# Patient Record
Sex: Female | Born: 1960 | Race: White | Hispanic: No | Marital: Single | Smoking: Former smoker
Health system: Southern US, Community
[De-identification: ages and names within clinical notes are randomized; demographics above are authoritative.]

## PROBLEM LIST (undated history)

## (undated) DIAGNOSIS — K5010 Crohn's disease of large intestine without complications: Secondary | ICD-10-CM

## (undated) HISTORY — PX: OTHER SURGICAL HISTORY: SHX169

---

## 2004-03-23 ENCOUNTER — Ambulatory Visit: Payer: Self-pay

## 2004-06-08 ENCOUNTER — Ambulatory Visit (HOSPITAL_BASED_OUTPATIENT_CLINIC_OR_DEPARTMENT_OTHER): Admission: RE | Admit: 2004-06-08 | Discharge: 2004-06-08 | Payer: Self-pay | Admitting: Orthopedic Surgery

## 2005-08-02 ENCOUNTER — Ambulatory Visit: Payer: Self-pay | Admitting: Family Medicine

## 2008-07-25 ENCOUNTER — Ambulatory Visit: Payer: Self-pay | Admitting: Family Medicine

## 2016-11-08 ENCOUNTER — Encounter: Payer: Self-pay | Admitting: Emergency Medicine

## 2016-11-08 ENCOUNTER — Emergency Department
Admission: EM | Admit: 2016-11-08 | Discharge: 2016-11-08 | Disposition: A | Discharge status: Home / Self Care | Payer: 59 | Attending: Emergency Medicine | Admitting: Emergency Medicine

## 2016-11-08 ENCOUNTER — Emergency Department: Payer: 59

## 2016-11-08 DIAGNOSIS — M25512 Pain in left shoulder: Secondary | ICD-10-CM | POA: Insufficient documentation

## 2016-11-08 DIAGNOSIS — R079 Chest pain, unspecified: Secondary | ICD-10-CM | POA: Diagnosis present

## 2016-11-08 DIAGNOSIS — Z87891 Personal history of nicotine dependence: Secondary | ICD-10-CM | POA: Insufficient documentation

## 2016-11-08 DIAGNOSIS — R51 Headache: Secondary | ICD-10-CM | POA: Insufficient documentation

## 2016-11-08 DIAGNOSIS — Z79899 Other long term (current) drug therapy: Secondary | ICD-10-CM | POA: Insufficient documentation

## 2016-11-08 DIAGNOSIS — R519 Headache, unspecified: Secondary | ICD-10-CM

## 2016-11-08 HISTORY — DX: Crohn's disease of large intestine without complications: K50.10

## 2016-11-08 LAB — BASIC METABOLIC PANEL
Anion gap: 9 (ref 5–15)
BUN: 8 mg/dL (ref 6–20)
CO2: 28 mmol/L (ref 22–32)
CREATININE: 0.88 mg/dL (ref 0.44–1.00)
Calcium: 9.7 mg/dL (ref 8.9–10.3)
Chloride: 104 mmol/L (ref 101–111)
GFR calc Af Amer: >60 mL/min (ref >60)
GLUCOSE: 150 mg/dL — AB (ref 65–99)
POTASSIUM: 3.4 mmol/L — AB (ref 3.5–5.1)
SODIUM: 141 mmol/L (ref 135–145)

## 2016-11-08 LAB — CBC
HEMATOCRIT: 41.8 % (ref 35.0–47.0)
Hemoglobin: 14.5 g/dL (ref 12.0–16.0)
MCH: 29.6 pg (ref 26.0–34.0)
MCHC: 34.7 g/dL (ref 32.0–36.0)
MCV: 85.4 fL (ref 80.0–100.0)
PLATELETS: 180 10*3/uL (ref 150–440)
RBC: 4.9 MIL/uL (ref 3.80–5.20)
RDW: 13.5 % (ref 11.5–14.5)
WBC: 6.8 10*3/uL (ref 3.6–11.0)

## 2016-11-08 LAB — TROPONIN I
Troponin I: <0.03 ng/mL (ref <0.03)
Troponin I: <0.03 ng/mL (ref <0.03)

## 2016-11-08 LAB — FIBRIN DERIVATIVES D-DIMER (ARMC ONLY): Fibrin derivatives D-dimer (ARMC): 187.57 (ref 0.00–499.00)

## 2016-11-08 MED ORDER — ACETAMINOPHEN 500 MG PO TABS: 1000.0000 mg | ORAL_TABLET | Freq: Three times a day (TID) | ORAL | 0 refills | Status: AC

## 2016-11-08 MED ORDER — METOCLOPRAMIDE HCL 5 MG/ML IJ SOLN
10.0000 mg | Freq: Once | INTRAMUSCULAR | Status: AC
  Administered 2016-11-08: 10 mg via INTRAVENOUS
  Filled 2016-11-08: qty 2

## 2016-11-08 MED ORDER — DIPHENHYDRAMINE HCL 50 MG/ML IJ SOLN
25.0000 mg | Freq: Once | INTRAMUSCULAR | Status: AC
  Administered 2016-11-08: 25 mg via INTRAVENOUS
  Filled 2016-11-08: qty 1

## 2016-11-08 MED ORDER — DICLOFENAC SODIUM 1 % TD GEL: TRANSDERMAL | 0 refills | Status: AC

## 2016-11-08 MED ORDER — PREDNISONE 10 MG PO TABS: 40.0000 mg | ORAL_TABLET | Freq: Every day | ORAL | 0 refills | Status: AC

## 2016-11-08 MED ORDER — KETOROLAC TROMETHAMINE 30 MG/ML IJ SOLN
15.0000 mg | Freq: Once | INTRAMUSCULAR | Status: AC
  Administered 2016-11-08: 15 mg via INTRAVENOUS
  Filled 2016-11-08: qty 1

## 2016-11-08 NOTE — Discharge Instructions (Signed)
Apply Voltaren gel and massage it in place 4 times a day, apply heat to the area multiple times a day, take Tylenol 1000 mg every 8 hours and prednisone 40 mg once a day for the next 3-5 days. If after 2 days you are not feeling improved and your symptoms have not resolved please return to the emergency room for further evaluation. Otherwise follow-up with her primary care doctor once you return home. Return to the emergency room if you have new or worsening chest pain, shortness of breath, facial droop, slurred speech, unilateral weakness or numbness, or any other symptoms concerning to you.

## 2016-11-08 NOTE — ED Provider Notes (Signed)
[facility] Emergency Department Provider Note  ____________________________________________  Time seen: Approximately 12:09 PM  I have reviewed the triage vital signs and the nursing notes.   HISTORY  Chief Complaint Chest Pain   HPI Paula Ross is a 56 y.o. female with a history of Crohn's disease and left rotator cuff tear repair who presents for evaluation of chest pain. Patient reports for the last 4 days she has had pain that she describes as dull/squeezing located in her left upper chest close to her shoulder joint radiating to her L arm and L neck. The pain has been constant and worse with movement of her shoulder. She denies shortness of breath but does tell me that sometimes he feels a little hard to take a deep breath because of the pain. She denies any trauma to her shoulder. She has family history of ischemic heart disease in both her mom and father. She is not a smoker. She has never seen a cardiologist or underwent a stress test. She currently reports that her pain is a 6 out of 10. She denies personal or family history of blood clots, hemoptysis, exogenous hormones. She does report that a month ago she drove 11 hours from [place] here to visit her son. She is also complaining of a left-sided headache that has similar characteristics as the pain in her shoulder, goal, 7 out of 10, constant for 4 days. She does have a history of migraine headaches and she was younger but those usually were bilateral. She has been taking Tylenol 325 mg and ibuprofen 100 mg at home once a day with no relief of her symptoms. No thunderclap HA, no neuro deficits, no paresthesias of her extremities.   Past Medical History:  Diagnosis Date  . Crohn's colitis (HCC)     There are no active problems to display for this patient.   Past Surgical History:  Procedure Laterality Date  . shoulder surgery      Prior to Admission medications   Medication Sig Start Date  End Date Taking? Authorizing Provider  acetaminophen (TYLENOL) 500 MG tablet Take 2 tablets (1,000 mg total) by mouth every 8 (eight) hours. 11/08/16 11/11/16  Nita Sickle, MD  diclofenac sodium (VOLTAREN) 1 % GEL Apply to the L shoulder and neck and massage it 4 times a day 11/08/16   Don Perking, [place], MD  predniSONE (DELTASONE) 10 MG tablet Take 4 tablets (40 mg total) by mouth daily. 11/08/16 11/13/16  Nita Sickle, MD    Allergies Penicillins  Family History Father - heart attack Mother - heart attack  Social History Social History  Substance Use Topics  . Smoking status: Former Games developer  . Smokeless tobacco: Never Used  . Alcohol use Yes    Review of Systems  Constitutional: Negative for fever. Eyes: Negative for visual changes. ENT: Negative for sore throat. Neck: No neck pain  Cardiovascular: + chest pain. Respiratory: + shortness of breath. Gastrointestinal: Negative for abdominal pain, vomiting or diarrhea. Genitourinary: Negative for dysuria. Musculoskeletal: Negative for back pain. + L shoulder pain Skin: Negative for rash. Neurological: Negative for weakness or numbness. + HA Psych: No SI or HI  ____________________________________________   PHYSICAL EXAM:  VITAL SIGNS: ED Triage Vitals  Enc Vitals Group     BP 11/08/16 0912 (!) 150/77     Pulse Rate 11/08/16 0912 77     Resp 11/08/16 0912 18     Temp 11/08/16 0912 97.8 F (36.6 C)  Temp Source 11/08/16 0912 Oral     SpO2 11/08/16 0912 97 %     Weight 11/08/16 0913 170 lb (77.1 kg)     Height 11/08/16 0913 5\' 9"  (1.753 m)     Head Circumference --      Peak Flow --      Pain Score 11/08/16 0925 7     Pain Loc --      Pain Edu? --      Excl. in GC? --     Constitutional: Alert and oriented. Well appearing and in no apparent distress. HEENT:      Head: Normocephalic and atraumatic.         Eyes: Conjunctivae are normal. Sclera is non-icteric.       Mouth/Throat: Mucous membranes are  moist.       Neck: Supple with no signs of meningismus. Cardiovascular: Regular rate and rhythm. No murmurs, gallops, or rubs. 2+ symmetrical distal pulses are present in all extremities. No JVD. Respiratory: Normal respiratory effort. Lungs are clear to auscultation bilaterally. No wheezes, crackles, or rhonchi.  Gastrointestinal: Soft, non tender, and non distended with positive bowel sounds. No rebound or guarding. Musculoskeletal: Extension and abduction of the the left arm exacerbates the pain, Left shoulder ROM is cogwhelling and not smooth and exacerbates the pain. Neurologic: Normal speech and language. A & O x3, PERRL, no nystagmus, CN II-XII intact, motor testing reveals good tone and bulk throughout. There is no evidence of pronator drift or dysmetria. Muscle strength is 5/5 throughout. Sensory examination is intact. Gait is normal. Skin: Skin is warm, dry and intact. No rash noted. Psychiatric: Mood and affect are normal. Speech and behavior are normal.  ____________________________________________   LABS (all labs ordered are listed, but only abnormal results are displayed)  Labs Reviewed  BASIC METABOLIC PANEL - Abnormal; Notable for the following:       Result Value   Potassium 3.4 (*)    Glucose, Bld 150 (*)    All other components within normal limits  CBC  TROPONIN I  TROPONIN I  FIBRIN DERIVATIVES D-DIMER ([facility] ONLY)   ____________________________________________  EKG  ED ECG REPORT I, Nita Sicklearolina Ree Alcalde, the attending physician, personally viewed and interpreted this ECG.  Normal sinus rhythm with occasional PVCs, rate of 85, normal intervals, normal axis, no ST elevations or depressions. ____________________________________________  RADIOLOGY  CXR: Negative  ____________________________________________   PROCEDURES  Procedure(s) performed: None Procedures Critical Care performed:  None ____________________________________________   INITIAL  IMPRESSION / ASSESSMENT AND PLAN / ED COURSE   56 y.o. female with a history of Crohn's disease and left rotator cuff tear repair who presents for evaluation of chest pain constant x 4 days and HA. At this time based on physical and history a do believe her pain is coming from her left shoulder. She has cogwheeling with movement of her left shoulder joint and exacerbation of pain. The fact that her headache is also located on the left as well I believe her symptoms are due to MSK pain. However since patient has had a recent prolonged trip complaining of mild shortness of breath and will send a d-dimer to rule out PE. We'll treat her symptoms with anti-inflammatory medication. EKG and first troponin normal. Heart score of 3 for family history and age.   ----------------------------------------- 12:18 PM on 11/08/2016 -----------------------------------------   OBSERVATION CARE: This patient is being placed under observation care for the following reasons: Chest pain with repeat testing to rule out ischemia  -----------------------------------------  1:18 PM on 11/08/2016 -----------------------------------------  Patient feels improved. D-dimer and 2nd troponin pending.   ----------------------------------------- 1:50 PM on 11/08/2016 -----------------------------------------   END OF OBSERVATION STATUS: After an appropriate period of observation, this patient is being discharged due to the following reason(s):     Clinical Course as of Nov 08 1348  Mon Nov 08, 2016  1343 Patient feels markedly improved with anti-inflammatory medication. Labs WNL. I will discharge her home on Voltaren gel, high-dose Tylenol, and a short course of prednisone for inflammation of her shoulder. Recommended she returns to the emergency room if her pain is getting worse or she develops any new symptoms including any signs or symptoms of stroke.  [CV]    Clinical Course User Index [CV] Nita SickleVeronese, [place], MD      Pertinent labs & imaging results that were available during my care of the patient were reviewed by me and considered in my medical decision making (see chart for details).    ____________________________________________   FINAL CLINICAL IMPRESSION(S) / ED DIAGNOSES  Final diagnoses:  Acute pain of left shoulder  Acute nonintractable headache, unspecified headache type      NEW MEDICATIONS STARTED DURING THIS VISIT:  New Prescriptions   ACETAMINOPHEN (TYLENOL) 500 MG TABLET    Take 2 tablets (1,000 mg total) by mouth every 8 (eight) hours.   DICLOFENAC SODIUM (VOLTAREN) 1 % GEL    Apply to the L shoulder and neck and massage it 4 times a day   PREDNISONE (DELTASONE) 10 MG TABLET    Take 4 tablets (40 mg total) by mouth daily.     Note:  This document was prepared using Dragon voice recognition software and may include unintentional dictation errors.    Don PerkingVeronese, [place], MD 11/08/16 1350

## 2016-11-08 NOTE — ED Triage Notes (Signed)
Pt reports left side chest pain that radiates to left arm and neck for 3-4 days. Pt reports associated shortness of breath.

## 2018-04-13 IMAGING — CR DG CHEST 2V
2 series · 2 of 2 positions shown · non-contrast
Comparison: Left shoulder MRI 10/18/2005.

CLINICAL DATA: 56 year old female with left side chest pain
radiating to the neck and arm 3-4 days. Shortness breath. Former
smoker.

EXAM:
CHEST  2 VIEW

[chest pa]
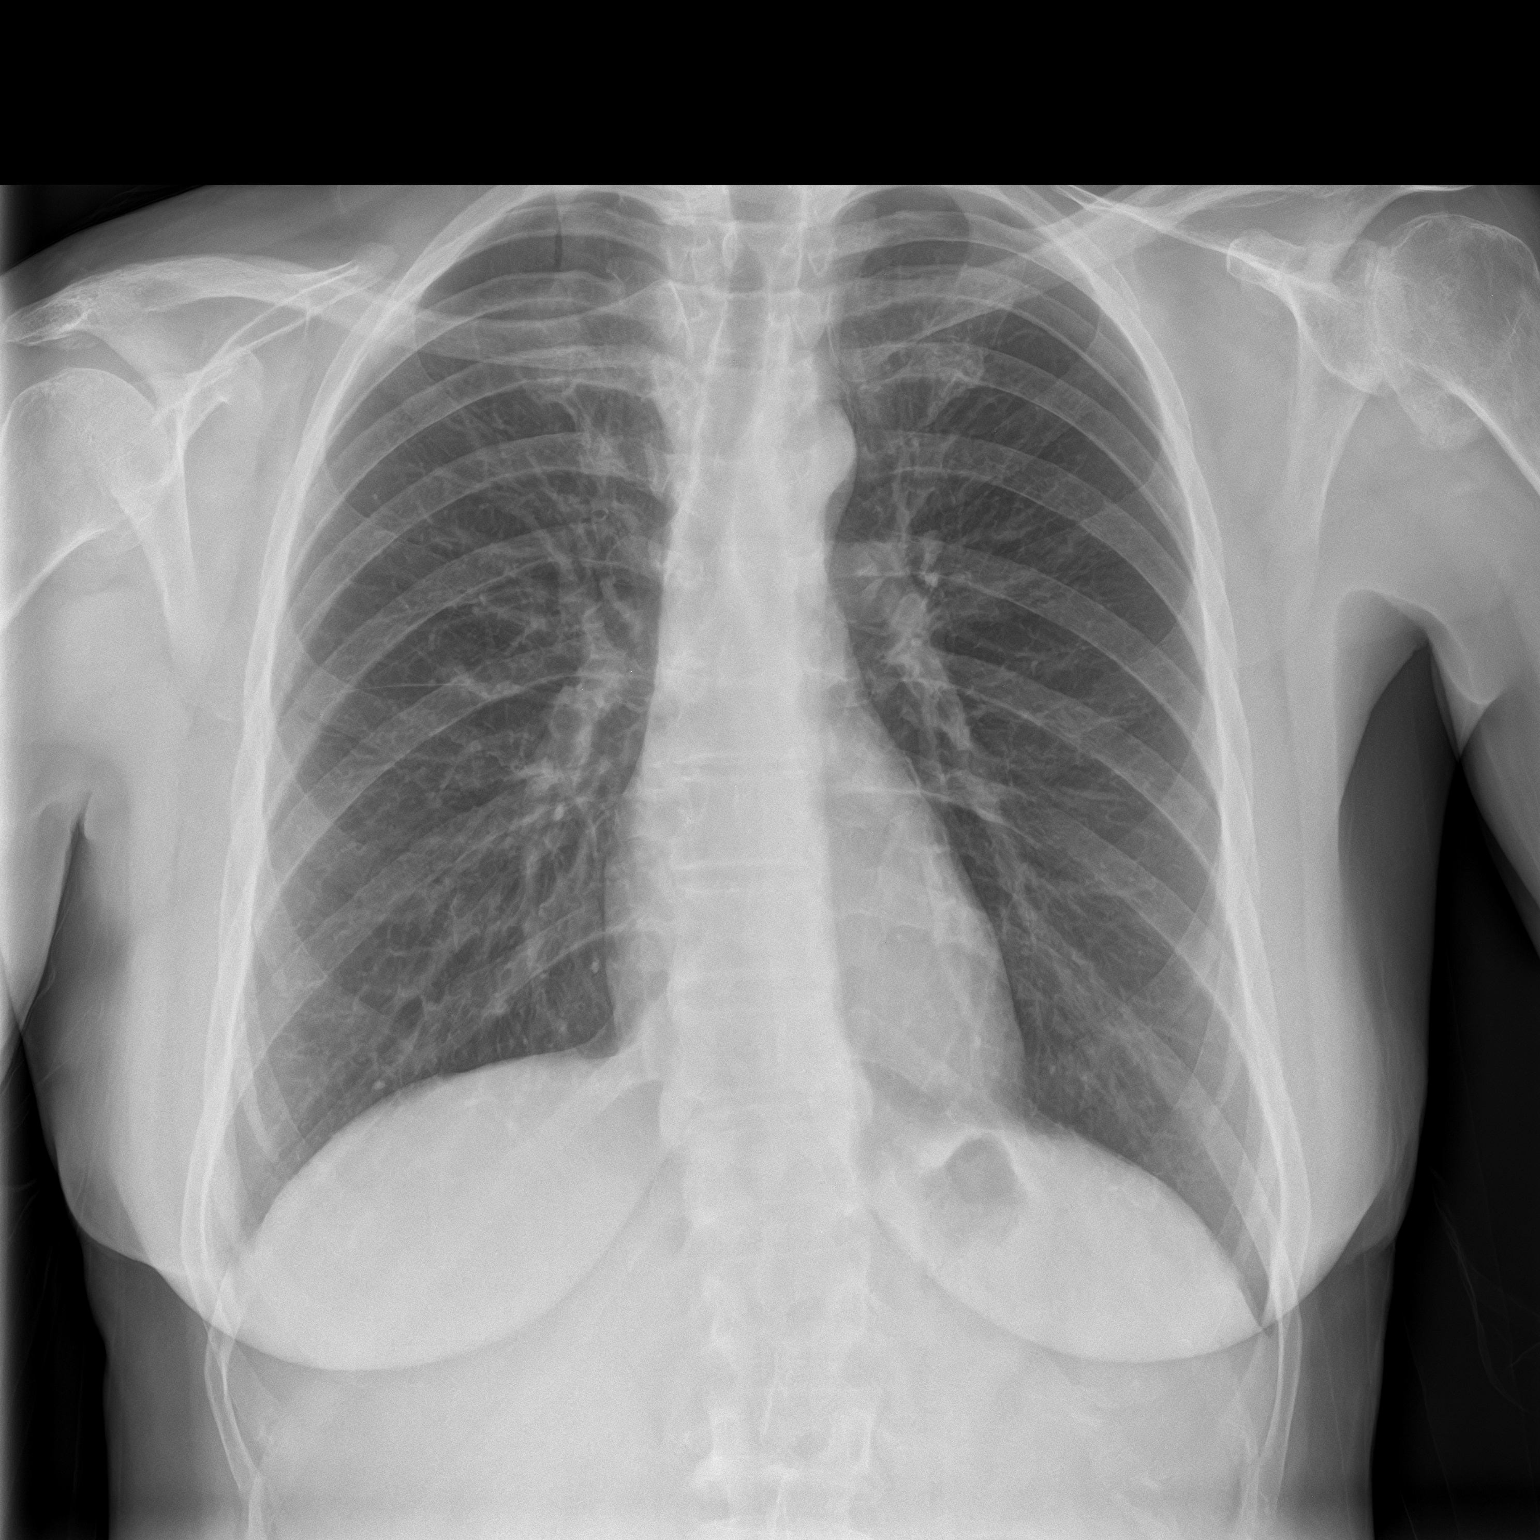

[chest lat]
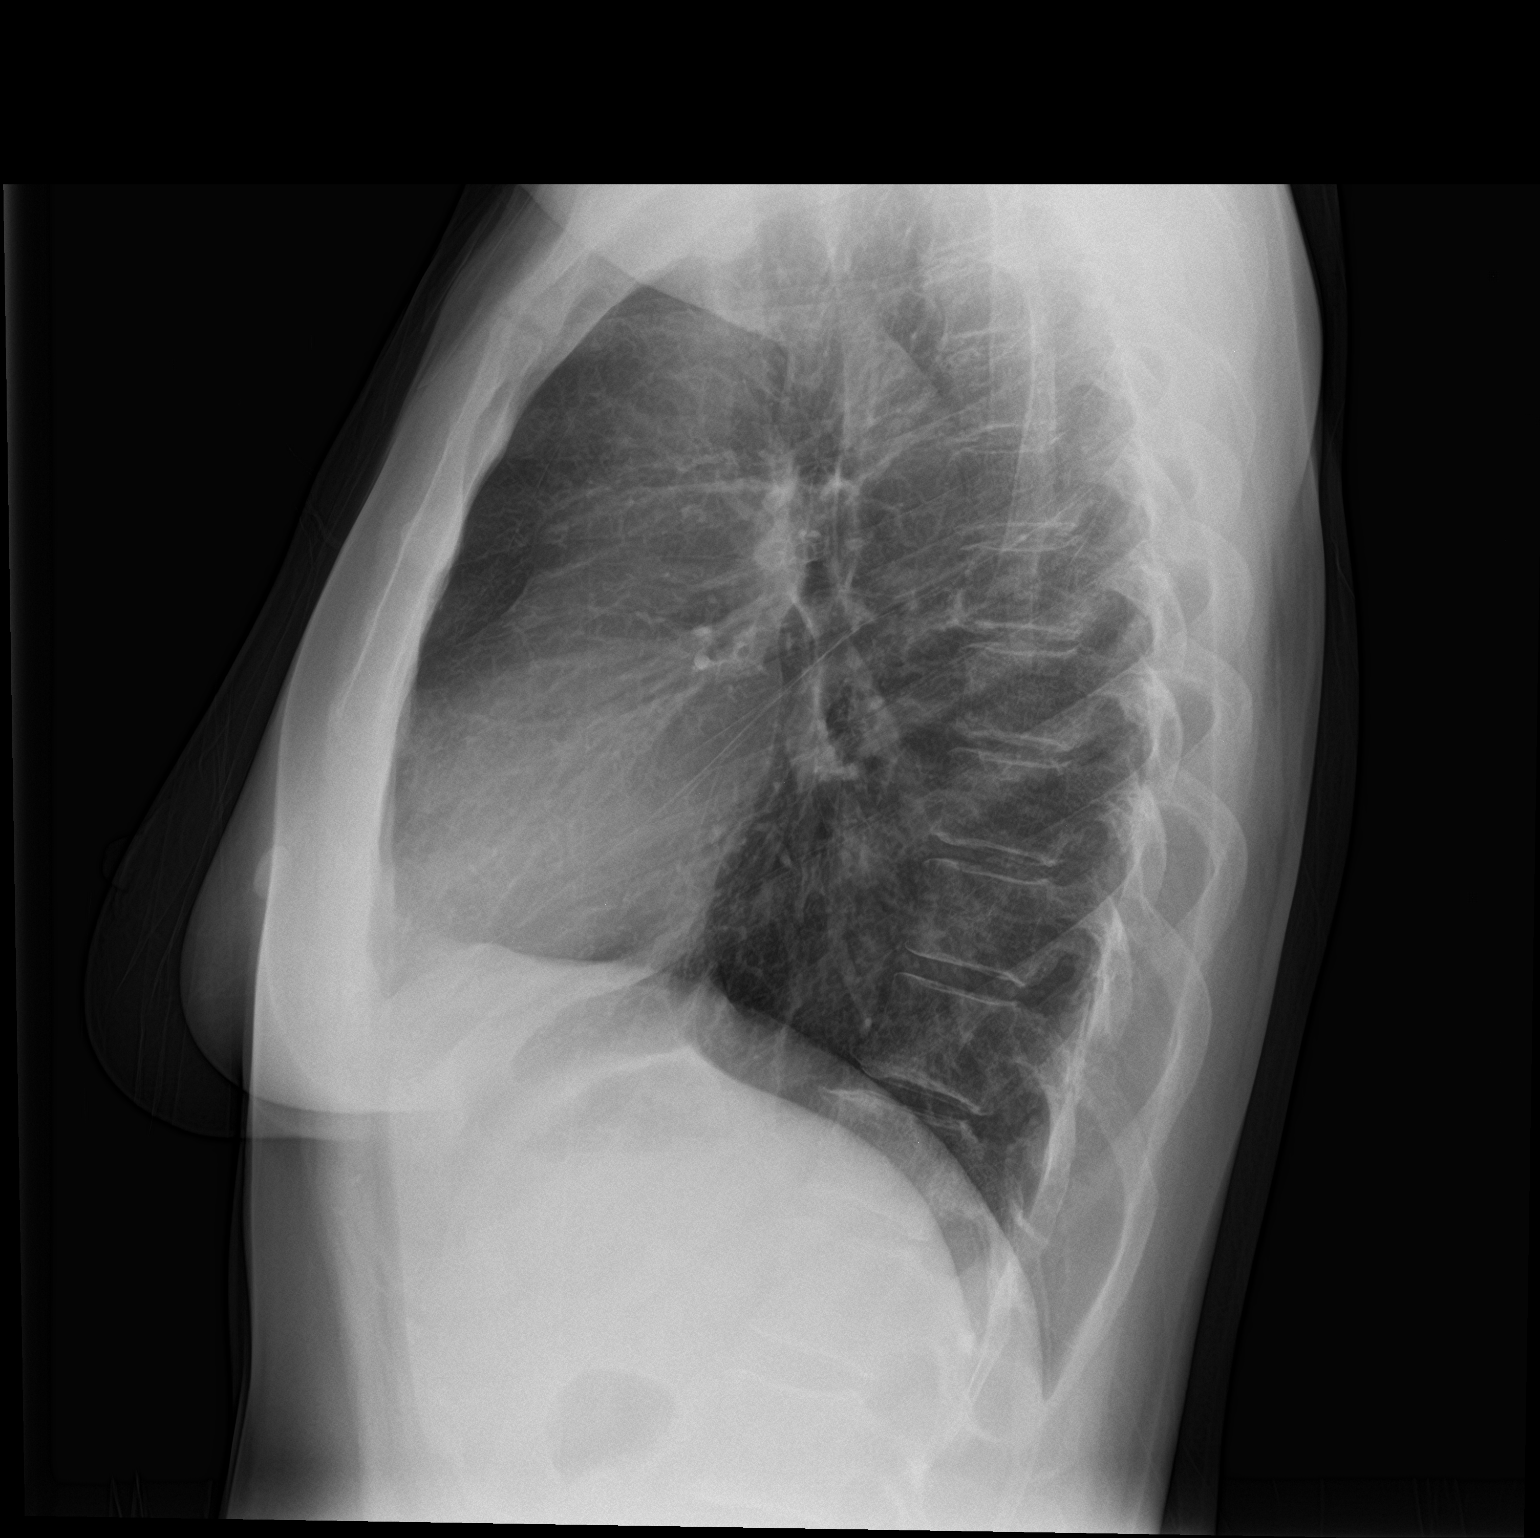

[2 of 2 positions shown; findings below may reference images not displayed]

FINDINGS: Normal lung volumes. Normal cardiac size and mediastinal contours.
Visualized tracheal air column is within normal limits. No
pneumothorax, pulmonary edema, pleural effusion or confluent
pulmonary opacity. Mild dextroconvex thoracic scoliosis. Bulky
osteophytosis at both glenohumeral joints. No acute osseous
abnormality identified. Negative visible bowel gas pattern.
IMPRESSION: Negative.  No acute cardiopulmonary abnormality.

## 2019-10-11 DEATH — deceased
